# Patient Record
Sex: Female | Born: 1985 | Race: White | Hispanic: No | Marital: Single | Smoking: Never smoker
Health system: Southern US, Community
[De-identification: ages and names within clinical notes are randomized; demographics above are authoritative.]

## PROBLEM LIST (undated history)

## (undated) DIAGNOSIS — J189 Pneumonia, unspecified organism: Secondary | ICD-10-CM

## (undated) DIAGNOSIS — Z87440 Personal history of urinary (tract) infections: Secondary | ICD-10-CM

## (undated) DIAGNOSIS — C801 Malignant (primary) neoplasm, unspecified: Secondary | ICD-10-CM

## (undated) HISTORY — PX: WISDOM TOOTH EXTRACTION: SHX21

## (undated) HISTORY — PX: DILATION AND CURETTAGE OF UTERUS: SHX78

## (undated) HISTORY — PX: MOHS SURGERY: SHX181

---
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
## 2008-06-29 IMAGING — CR DG CHEST 2V
2 series · 2 of 2 positions shown · non-contrast
Comparison: none

CLINICAL DATA: Flu symptoms, shortness of breath.  Cough, body aches.
 CHEST - 2 VIEW:

[view not recorded (1 of 2)]
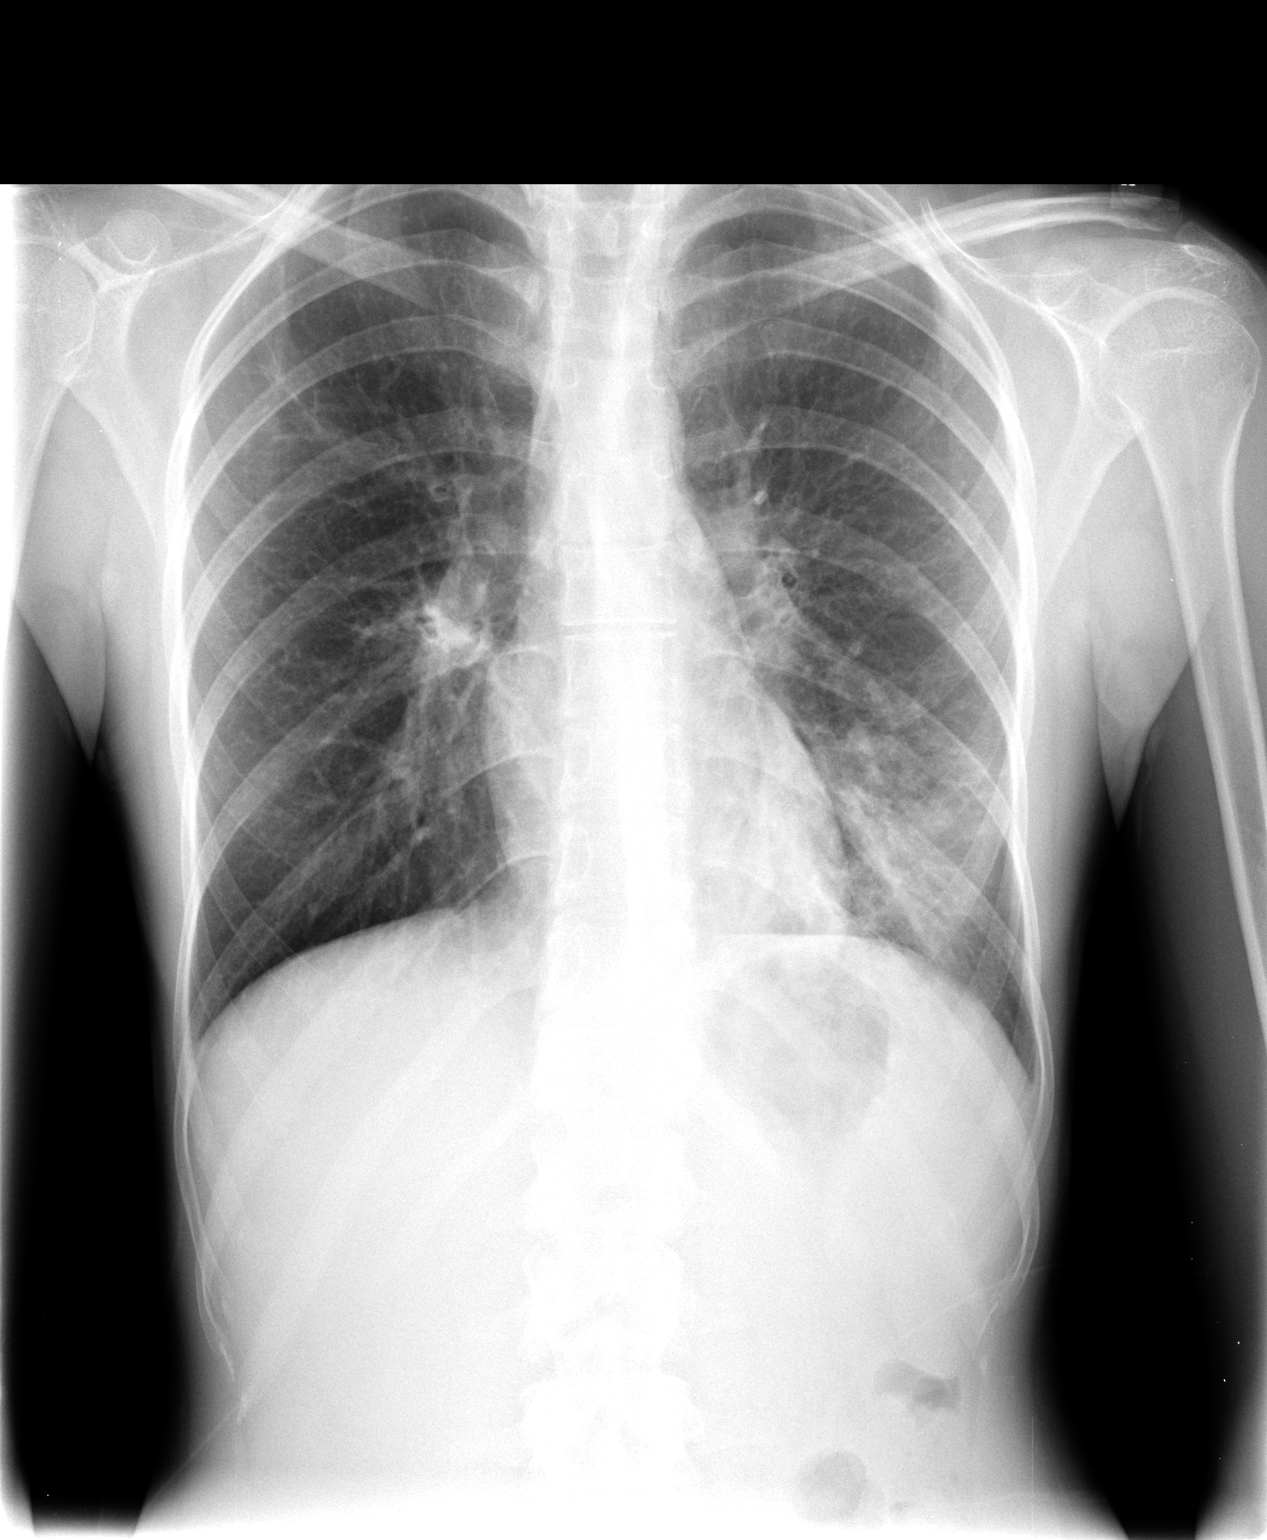

[view not recorded (2 of 2)]
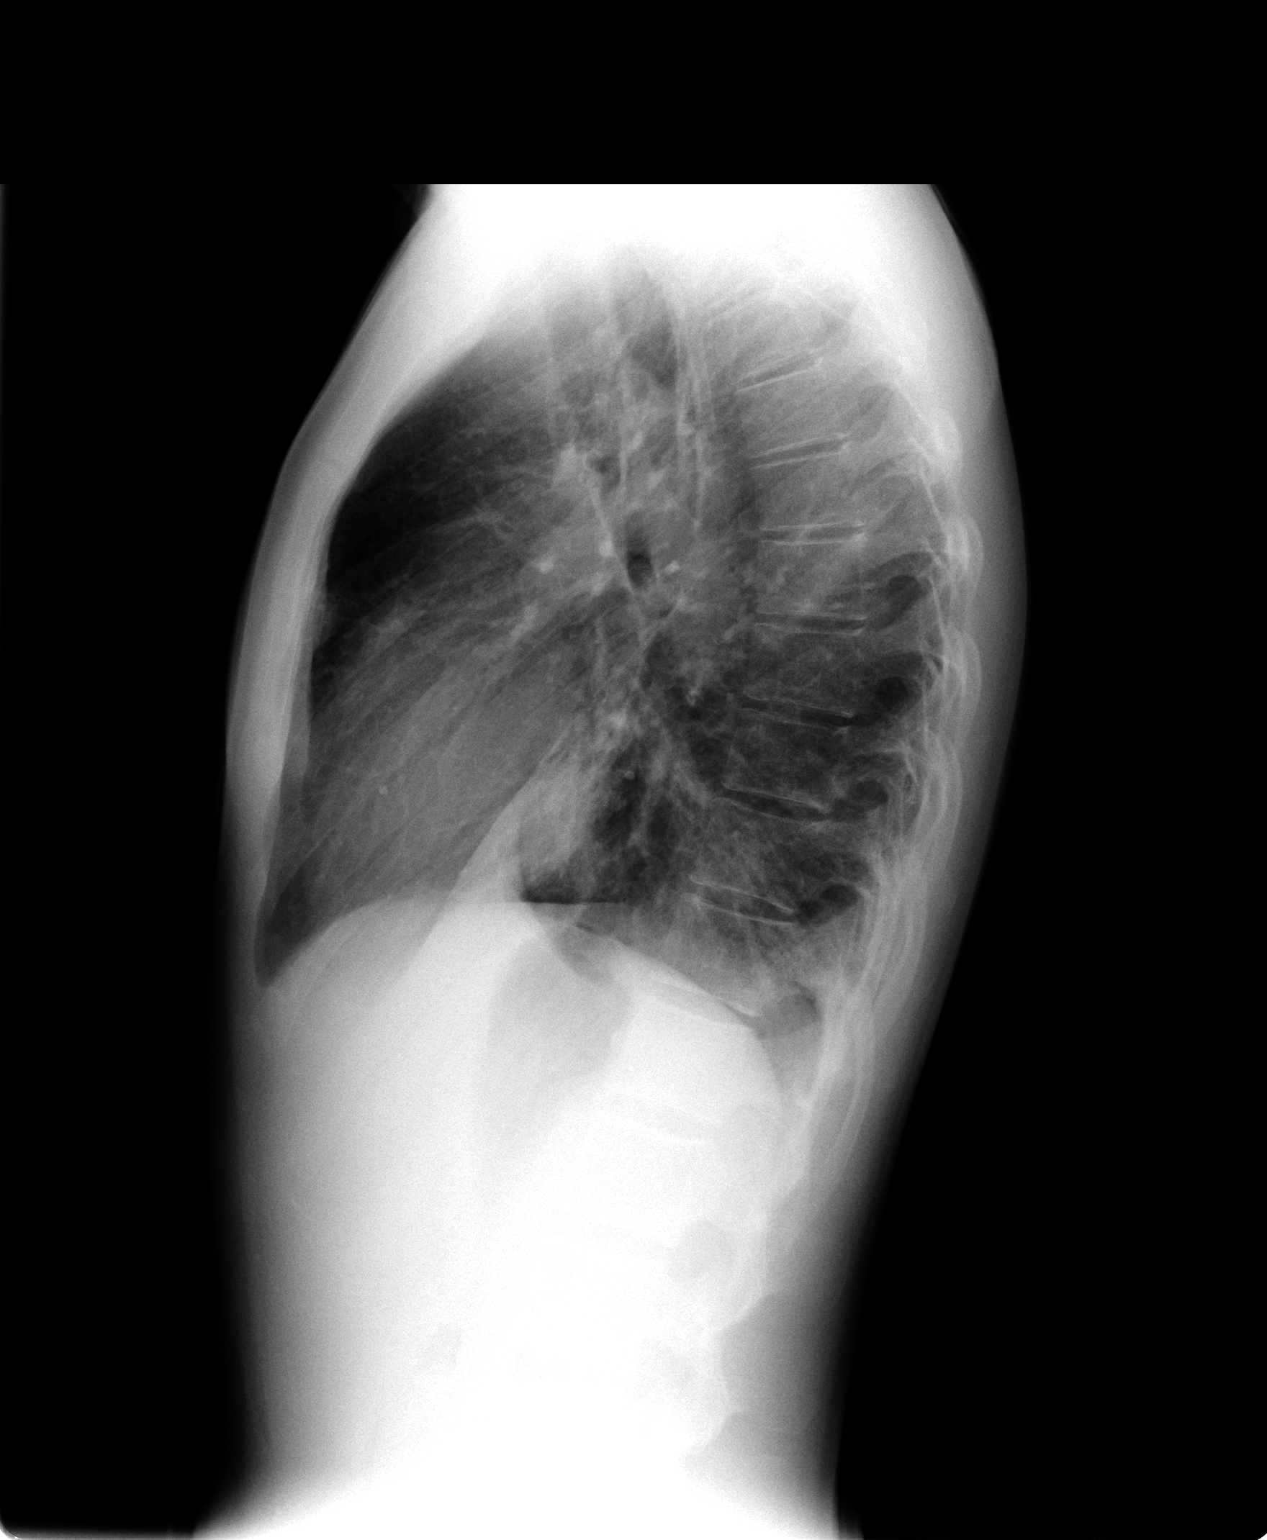

[2 of 2 positions shown; findings below may reference images not displayed]

FINDINGS: Infiltrate left lower lobe, mainly anterior basal segment.  Normal cardiac size, shape.
IMPRESSION: Left lower lobar pneumonia.

## 2013-08-08 ENCOUNTER — Other Ambulatory Visit (HOSPITAL_COMMUNITY): Payer: Self-pay | Admitting: *Deleted

## 2013-08-08 ENCOUNTER — Encounter (HOSPITAL_COMMUNITY)
Admission: RE | Admit: 2013-08-08 | Discharge: 2013-08-08 | Disposition: A | Discharge status: Home / Self Care | Payer: PRIVATE HEALTH INSURANCE | Source: Ambulatory Visit | Attending: Orthopedic Surgery | Admitting: Orthopedic Surgery

## 2013-08-08 ENCOUNTER — Encounter (HOSPITAL_COMMUNITY): Payer: Self-pay

## 2013-08-08 HISTORY — DX: Malignant (primary) neoplasm, unspecified: C80.1

## 2013-08-08 HISTORY — DX: Personal history of urinary (tract) infections: Z87.440

## 2013-08-08 HISTORY — DX: Pneumonia, unspecified organism: J18.9

## 2013-08-08 LAB — CBC
HCT: 39.1 % (ref 36.0–46.0)
HEMOGLOBIN: 14 g/dL (ref 12.0–15.0)
MCH: 31.1 pg (ref 26.0–34.0)
MCHC: 35.8 g/dL (ref 30.0–36.0)
MCV: 86.9 fL (ref 78.0–100.0)
Platelets: 290 10*3/uL (ref 150–400)
RBC: 4.5 MIL/uL (ref 3.87–5.11)
RDW: 12.4 % (ref 11.5–15.5)
WBC: 12 10*3/uL — AB (ref 4.0–10.5)

## 2013-08-08 LAB — HCG, SERUM, QUALITATIVE: Preg, Serum: NEGATIVE

## 2013-08-08 NOTE — Progress Notes (Signed)
Called Dr. Angus Palms office about consent order. Order states left ring finger and the finger that is injured in the left long finger. Receptionist states she will send message and have him call me. He returned my call and ok'ed that I change the order.

## 2013-08-08 NOTE — Pre-Procedure Instructions (Signed)
Sarah Michael  08/08/2013   Your procedure is scheduled on:  Saturday, August 10, 2013 at 10:20 AM.   Report to [facility] Registration at 8:20 AM.   Call this number if you have problems the morning of surgery: [phone]   Remember:   Do not eat food or drink liquids after midnight Friday, 08/09/13.   Take these medicines the morning of surgery with A SIP OF WATER: Hydrocodone-Acetaminophen   Stop Vitamins as of today   Do not wear jewelry, make-up or nail polish.  Do not wear lotions, powders, or perfumes. You may wear deodorant.  Do not shave 48 hours prior to surgery.   Do not bring valuables to the hospital.  [facility] is not responsible                  for any belongings or valuables.               Contacts, dentures or bridgework may not be worn into surgery.  Leave suitcase in the car. After surgery it may be brought to your room.  For patients admitted to the hospital, discharge time is determined by your                treatment team.               Patients discharged the day of surgery will not be allowed to drive  home.  Name and phone number of your driver: Family/friend   Special Instructions: [place] - Preparing for Surgery  Before surgery, you can play an important role.  Because skin is not sterile, your skin needs to be as free of germs as possible.  You can reduce the number of germs on you skin by washing with CHG (chlorahexidine gluconate) soap before surgery.  CHG is an antiseptic cleaner which kills germs and bonds with the skin to continue killing germs even after washing.  Please DO NOT use if you have an allergy to CHG or antibacterial soaps.  If your skin becomes reddened/irritated stop using the CHG and inform your nurse when you arrive at Short Stay.  Do not shave (including legs and underarms) for at least 48 hours prior to the first CHG shower.  You may shave your face.  Please follow these instructions  carefully:   1.  Shower with CHG Soap the night before surgery and the                                morning of Surgery.  2.  If you choose to wash your hair, wash your hair first as usual with your       normal shampoo.  3.  After you shampoo, rinse your hair and body thoroughly to remove the                      Shampoo.  4.  Use CHG as you would any other liquid soap.  You can apply chg directly       to the skin and wash gently with scrungie or a clean washcloth.  5.  Apply the CHG Soap to your body ONLY FROM THE NECK DOWN.        Do not use on open wounds or open sores.  Avoid contact with your eyes, ears, mouth and genitals (private parts).  Wash genitals (private parts) with your  normal soap.  6.  Wash thoroughly, paying special attention to the area where your surgery        will be performed.  7.  Thoroughly rinse your body with warm water from the neck down.  8.  DO NOT shower/wash with your normal soap after using and rinsing off       the CHG Soap.  9.  Pat yourself dry with a clean towel.            10.  Wear clean pajamas.            11.  Place clean sheets on your bed the night of your first shower and do not        sleep with pets.  Day of Surgery  Do not apply any lotions the morning of surgery.  Please wear clean clothes to the hospital/surgery center.     Please read over the following fact sheets that you were given: Pain Booklet, Coughing and Deep Breathing and Surgical Site Infection Prevention

## 2013-08-10 ENCOUNTER — Ambulatory Visit (HOSPITAL_COMMUNITY)
Admission: RE | Admit: 2013-08-10 | Discharge: 2013-08-10 | Disposition: A | Discharge status: Home / Self Care | Payer: PRIVATE HEALTH INSURANCE | Source: Ambulatory Visit | Attending: Orthopedic Surgery | Admitting: Orthopedic Surgery

## 2013-08-10 ENCOUNTER — Encounter (HOSPITAL_COMMUNITY)
Admission: RE | Disposition: A | Discharge status: Home / Self Care | Payer: Self-pay | Source: Ambulatory Visit | Attending: Orthopedic Surgery

## 2013-08-10 ENCOUNTER — Ambulatory Visit (HOSPITAL_COMMUNITY): Payer: PRIVATE HEALTH INSURANCE | Admitting: Anesthesiology

## 2013-08-10 ENCOUNTER — Encounter (HOSPITAL_COMMUNITY): Payer: Self-pay | Admitting: *Deleted

## 2013-08-10 ENCOUNTER — Encounter (HOSPITAL_COMMUNITY): Payer: PRIVATE HEALTH INSURANCE | Admitting: Anesthesiology

## 2013-08-10 DIAGNOSIS — X58XXXA Exposure to other specified factors, initial encounter: Secondary | ICD-10-CM | POA: Insufficient documentation

## 2013-08-10 DIAGNOSIS — IMO0002 Reserved for concepts with insufficient information to code with codable children: Secondary | ICD-10-CM | POA: Insufficient documentation

## 2013-08-10 HISTORY — PX: CLOSED REDUCTION FINGER WITH PERCUTANEOUS PINNING: SHX5612

## 2013-08-10 SURGERY — CLOSED REDUCTION, FINGER, WITH PERCUTANEOUS PINNING
Anesthesia: General | Site: Finger | Laterality: Left

## 2013-08-10 MED ORDER — OXYCODONE-ACETAMINOPHEN 5-325 MG PO TABS
ORAL_TABLET | ORAL | Status: AC
  Administered 2013-08-10: 2 via ORAL
  Filled 2013-08-10: qty 2

## 2013-08-10 MED ORDER — MIDAZOLAM HCL 2 MG/2ML IJ SOLN
INTRAMUSCULAR | Status: AC
  Filled 2013-08-10: qty 2

## 2013-08-10 MED ORDER — CLINDAMYCIN PHOSPHATE 900 MG/50ML IV SOLN
900.0000 mg | INTRAVENOUS | Status: DC
Start: 2013-08-10 — End: 2013-08-10

## 2013-08-10 MED ORDER — LIDOCAINE HCL (CARDIAC) 20 MG/ML IV SOLN
INTRAVENOUS | Status: AC
  Filled 2013-08-10: qty 5

## 2013-08-10 MED ORDER — FENTANYL CITRATE 0.05 MG/ML IJ SOLN
INTRAMUSCULAR | Status: AC
  Filled 2013-08-10: qty 5

## 2013-08-10 MED ORDER — HYDROMORPHONE HCL PF 1 MG/ML IJ SOLN
INTRAMUSCULAR | Status: AC
  Administered 2013-08-10: 0.5 mg via INTRAVENOUS
  Filled 2013-08-10: qty 1

## 2013-08-10 MED ORDER — PROPOFOL 10 MG/ML IV BOLUS
INTRAVENOUS | Status: AC
  Filled 2013-08-10: qty 20

## 2013-08-10 MED ORDER — BUPIVACAINE HCL (PF) 0.25 % IJ SOLN
INTRAMUSCULAR | Status: DC | PRN
  Administered 2013-08-10: 10 mL

## 2013-08-10 MED ORDER — ONDANSETRON HCL 4 MG/2ML IJ SOLN
INTRAMUSCULAR | Status: AC
  Filled 2013-08-10: qty 2

## 2013-08-10 MED ORDER — MIDAZOLAM HCL 5 MG/5ML IJ SOLN
INTRAMUSCULAR | Status: DC | PRN
  Administered 2013-08-10: 1 mg via INTRAVENOUS

## 2013-08-10 MED ORDER — LACTATED RINGERS IV SOLN
INTRAVENOUS | Status: DC | PRN
  Administered 2013-08-10 (×2): via INTRAVENOUS

## 2013-08-10 MED ORDER — BUPIVACAINE HCL (PF) 0.25 % IJ SOLN
INTRAMUSCULAR | Status: AC
  Filled 2013-08-10: qty 30

## 2013-08-10 MED ORDER — HYDROMORPHONE HCL PF 1 MG/ML IJ SOLN
0.2500 mg | INTRAMUSCULAR | Status: DC | PRN
  Administered 2013-08-10 (×2): 0.5 mg via INTRAVENOUS

## 2013-08-10 MED ORDER — DOCUSATE SODIUM 100 MG PO CAPS: 100.0000 mg | ORAL_CAPSULE | Freq: Two times a day (BID) | ORAL | Status: AC

## 2013-08-10 MED ORDER — 0.9 % SODIUM CHLORIDE (POUR BTL) OPTIME
TOPICAL | Status: DC | PRN
  Administered 2013-08-10: 1000 mL

## 2013-08-10 MED ORDER — OXYCODONE-ACETAMINOPHEN 5-325 MG PO TABS: 1.0000 | ORAL_TABLET | ORAL | Status: AC | PRN

## 2013-08-10 MED ORDER — CHLORHEXIDINE GLUCONATE 4 % EX LIQD
60.0000 mL | Freq: Once | CUTANEOUS | Status: DC
  Filled 2013-08-10: qty 60

## 2013-08-10 MED ORDER — ONDANSETRON HCL 4 MG/2ML IJ SOLN
INTRAMUSCULAR | Status: DC | PRN
  Administered 2013-08-10: 4 mg via INTRAVENOUS

## 2013-08-10 MED ORDER — FENTANYL CITRATE 0.05 MG/ML IJ SOLN
INTRAMUSCULAR | Status: DC | PRN
  Administered 2013-08-10: 50 ug via INTRAVENOUS

## 2013-08-10 MED ORDER — CLINDAMYCIN PHOSPHATE 900 MG/50ML IV SOLN
INTRAVENOUS | Status: AC
  Administered 2013-08-10: 900 mg via INTRAVENOUS
  Filled 2013-08-10: qty 50

## 2013-08-10 MED ORDER — OXYCODONE-ACETAMINOPHEN 5-325 MG PO TABS
1.0000 | ORAL_TABLET | Freq: Once | ORAL | Status: AC
  Administered 2013-08-10: 2 via ORAL

## 2013-08-10 MED ORDER — LIDOCAINE HCL (CARDIAC) 20 MG/ML IV SOLN
INTRAVENOUS | Status: DC | PRN
  Administered 2013-08-10: 90 mg via INTRAVENOUS

## 2013-08-10 MED ORDER — PROPOFOL 10 MG/ML IV BOLUS
INTRAVENOUS | Status: DC | PRN
  Administered 2013-08-10: 170 mg via INTRAVENOUS

## 2013-08-10 SURGICAL SUPPLY — 35 items
BANDAGE CONFORM 2  STR LF (GAUZE/BANDAGES/DRESSINGS) ×3 IMPLANT
BANDAGE ELASTIC 3 VELCRO ST LF (GAUZE/BANDAGES/DRESSINGS) IMPLANT
BANDAGE ELASTIC 4 VELCRO ST LF (GAUZE/BANDAGES/DRESSINGS) IMPLANT
BANDAGE GAUZE ELAST BULKY 4 IN (GAUZE/BANDAGES/DRESSINGS) IMPLANT
BENZOIN TINCTURE PRP APPL 2/3 (GAUZE/BANDAGES/DRESSINGS) IMPLANT
BLADE SURG ROTATE 9660 (MISCELLANEOUS) IMPLANT
BNDG COHESIVE 1X5 TAN STRL LF (GAUZE/BANDAGES/DRESSINGS) ×3 IMPLANT
CLOSURE WOUND 1/2 X4 (GAUZE/BANDAGES/DRESSINGS)
COVER SURGICAL LIGHT HANDLE (MISCELLANEOUS) ×3 IMPLANT
CUFF TOURNIQUET SINGLE 18IN (TOURNIQUET CUFF) ×3 IMPLANT
CUFF TOURNIQUET SINGLE 24IN (TOURNIQUET CUFF) IMPLANT
DRAPE OEC MINIVIEW 54X84 (DRAPES) ×3 IMPLANT
DRSG EMULSION OIL 3X3 NADH (GAUZE/BANDAGES/DRESSINGS) IMPLANT
GAUZE XEROFORM 1X8 LF (GAUZE/BANDAGES/DRESSINGS) ×3 IMPLANT
GLOVE BIOGEL PI IND STRL 8.5 (GLOVE) ×1 IMPLANT
GLOVE BIOGEL PI INDICATOR 8.5 (GLOVE) ×2
GLOVE SURG ORTHO 8.0 STRL STRW (GLOVE) ×3 IMPLANT
GOWN STRL REUS W/ TWL LRG LVL3 (GOWN DISPOSABLE) ×2 IMPLANT
GOWN STRL REUS W/TWL LRG LVL3 (GOWN DISPOSABLE) ×4
KIT BASIN OR (CUSTOM PROCEDURE TRAY) ×3 IMPLANT
KIT ROOM TURNOVER OR (KITS) ×3 IMPLANT
NS IRRIG 1000ML POUR BTL (IV SOLUTION) ×3 IMPLANT
PACK ORTHO EXTREMITY (CUSTOM PROCEDURE TRAY) ×3 IMPLANT
PAD ARMBOARD 7.5X6 YLW CONV (MISCELLANEOUS) ×6 IMPLANT
SPLINT FINGER (SOFTGOODS) ×3 IMPLANT
SPONGE GAUZE 4X4 12PLY (GAUZE/BANDAGES/DRESSINGS) ×3 IMPLANT
STRIP CLOSURE SKIN 1/2X4 (GAUZE/BANDAGES/DRESSINGS) IMPLANT
SUT ETHILON 4 0 P 3 18 (SUTURE) IMPLANT
SUT ETHILON 5 0 P 3 18 (SUTURE)
SUT NYLON ETHILON 5-0 P-3 1X18 (SUTURE) IMPLANT
SUT PROLENE 4 0 P 3 18 (SUTURE) IMPLANT
TOWEL OR 17X24 6PK STRL BLUE (TOWEL DISPOSABLE) ×3 IMPLANT
TOWEL OR 17X26 10 PK STRL BLUE (TOWEL DISPOSABLE) ×3 IMPLANT
WATER STERILE IRR 1000ML POUR (IV SOLUTION) IMPLANT
k-wire 0.9mm (0.035") ×3 IMPLANT

## 2013-08-10 NOTE — Progress Notes (Signed)
Pt has a friend who will drive her home. Pt says her boyfriend is at work, but will be with her once she gets home.

## 2013-08-10 NOTE — H&P (Signed)
Sarah Michael is an 28 y.o. female.   Chief Complaint: LEFT LONG FINGER INJURY HPI: PT SEEN/EVALUATED IN OFFICE PT WITH INJURY TO LEFT LONG FINGER. PT WITH LONG FINGER MIDDLE PHALANX FRACTURE PT HERE FOR SURGERY  Past Medical History  Diagnosis Date  . Pneumonia   . History of kidney infection   . 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 Mohs surgery      on her back  . Wisdom tooth extraction    . Dilation and curettage of uterus      for a miscarriage    Family History  Problem Relation Age of Onset  . Cancer - Other Father    Social History:  reports that she has never smoked. She has never used smokeless tobacco. She reports that she drinks alcohol. She reports that she does not use illicit drugs.  Allergies:  Allergies  Allergen Reactions  . Amoxicillin Hives    Medications Prior to Admission  Medication Sig Dispense Refill  . doxycycline (VIBRAMYCIN) 100 MG capsule Take 100 mg by mouth 2 (two) times daily.      . Hydrocodone-Acetaminophen 5-300 MG TABS Take 1 tablet by mouth every 4 (four) hours as needed (pain).      . Multiple Vitamins-Minerals (MULTIVITAMIN WITH MINERALS) tablet Take 1 tablet by mouth daily.        Results for orders placed during the hospital encounter of 08/08/13 (from the past 48 hour(s))  CBC     Status: Abnormal   Collection Time    08/08/13  3:05 PM      Result Value Ref Range   WBC 12.0 (*) 4.0 - 10.5 K/uL   RBC 4.50  3.87 - 5.11 MIL/uL   Hemoglobin 14.0  12.0 - 15.0 g/dL   HCT 39.1  36.0 - 46.0 %   MCV 86.9  78.0 - 100.0 fL   MCH 31.1  26.0 - 34.0 pg   MCHC 35.8  30.0 - 36.0 g/dL   RDW 12.4  11.5 - 15.5 %   Platelets 290  150 - 400 K/uL  HCG, SERUM, QUALITATIVE     Status: None   Collection Time    08/08/13  3:05 PM      Result Value Ref Range   Preg, Serum NEGATIVE  NEGATIVE   Comment:            THE SENSITIVITY OF THIS     METHODOLOGY IS >10 mIU/mL.   No  results found.  ROS NO RECENT ILLNESSES OR HOSPITALIZATIONS  Blood pressure 114/64, pulse 52, temperature 98 F (36.7 C), temperature source Oral, resp. rate 16, last menstrual period 08/08/2013, SpO2 100.00%. Physical Exam  General Appearance:  Alert, cooperative, no distress, appears stated age  Head:  Normocephalic, without obvious abnormality, atraumatic  Eyes:  Pupils equal, conjunctiva/corneas clear,         Throat: Lips, mucosa, and tongue normal; teeth and gums normal  Neck: No visible masses     Lungs:   respirations unlabored  Chest Wall:  No tenderness or deformity  Heart:  Regular rate and rhythm,  Abdomen:   Soft, non-tender,         Extremities: LEFT LONG FINGER WITH ECCHYMOSES AND SWELLING LIMITED DIGITAL MOBILITY FINGER TIP WARM WELL PERFUSED GOOD CAP REFIL NO INJURY TO INDEX/RING/SMALL AND THUMB GOOD WRIST MOBILITY  Pulses: 2+ and symmetric  Skin: Skin color, texture, turgor normal,  no rashes or lesions     Neurologic: Normal    Assessment/Plan LEFT LONG FINGER DISPLACED INTRAARTICULAR MIDDLE PHALANX FRACTURE  LEFT LONG FINGER CLOSED REDUCTION AND PINNING POSSIBLE OPEN REDUCTION AND INTERNAL FIXATION  R/B/A DISCUSSED WITH PT IN OFFICE.  PT VOICED UNDERSTANDING OF PLAN CONSENT SIGNED DAY OF SURGERY PT SEEN AND EXAMINED PRIOR TO OPERATIVE PROCEDURE/DAY OF SURGERY SITE MARKED. QUESTIONS ANSWERED WILL GO HOME FOLLOWING SURGERY  Linna Hoff 08/10/2013, 8:58 AM

## 2013-08-10 NOTE — Op Note (Signed)
NAME:  KINZE, LABO NO.:  [id number]  MEDICAL RECORD NO.:  [id number]  LOCATION:  [facility]                         FACILITY:  [facility]  PHYSICIAN:  Melrose Nakayama, MD  DATE OF BIRTH:  [birth year]  DATE OF PROCEDURE:  08/10/2013 DATE OF DISCHARGE:  08/10/2013                              OPERATIVE REPORT   PREOPERATIVE DIAGNOSIS:  Left long finger articular fracture involving the distal interphalangeal joint.  POSTOPERATIVE DIAGNOSIS:  Left long finger articular fracture involving the distal interphalangeal joint.  ATTENDING PHYSICIAN:  Linna Hoff IV, MD, who scrubbed and present for the entire procedure.  ASSISTANT SURGEON:  None.  ANESTHESIA:  General via LMA.  SURGICAL PROCEDURES: 1. Left long finger closed reduction and percutaneous skeletal     fixation of unstable articular fracture of the left long finger     middle phalanx. 2. Radiographs 2 views, left long finger.  SURGICAL IMPLANTS:  Two 0.035 K-wires.  SURGICAL INDICATIONS:  Ms. Steppe is a right-hand dominant female who was seen and evaluated in the office and found a displaced intra- articular fracture.  The patient was seen and evaluated in the office and recommended to undergo the above procedure.  Risks, benefits, and alternatives were discussed in detail with the patient.  Signed informed consent was obtained.  Risks include but not limited to bleeding, infection, damage to nearby nerves, arteries, or tendons, nonunion, malunion, hardware failure, loss of motion wrist and digits, incomplete relief of symptoms, and need for further surgical intervention.  DESCRIPTION OF PROCEDURE:  The patient was properly identified in the preoperative holding area and marked with a permanent marker made on left long finger to indicate correct operative site.  The patient was brought back to the operating room, placed supine on the anesthesia table.  General anesthesia was administered.  The  patient tolerated this well.  A well-padded tourniquet was placed on left brachium and sealed with 1000 drape.  Left upper extremity was then prepped and draped in normal sterile fashion.  Time-out was called, correct side was identified, and procedure then begun.  Attention then turned to the left long finger where we used the mini C-arm.  The fracture site was then identified and then the small reduction clamp was then placed across the fracture site engaging the fracture site and reducing it nicely. Following this, the articular fracture was reduced and confirmed using mini C-arm.  Following this, two 0.035 K-wires were then placed across the fracture site and reduction clamp was then removed.  Final radiographs were obtained.  K-wire was then bent and cut and left out of the skin.  Xeroform was then placed around the pin sites.  Sterile compressive bandage was then applied.  The patient was placed in a small finger splint, extubated, and taken to recovery room in good condition.  Intraoperative radiographs 2 views of the finger do show the internal fixation in place in good position.  POSTPROCEDURE PLAN:  The patient was discharged to home, seen back in the office in approximately 10 days for x-rays, out of the splint, back into the finger splint for a total of 4 weeks with pins in.  X-rays at the  4-week mark for pin removal and then gradually use and activity. Radiographs at each visit.     Melrose Nakayama, MD     FWO/MEDQ  D:  08/10/2013  T:  08/10/2013  Job:  [id number]

## 2013-08-10 NOTE — Transfer of Care (Signed)
Immediate Anesthesia Transfer of Care Note  Patient: Sarah Michael  Procedure(s) Performed: Procedure(s): CLOSED REDUCTION LEFT LONG FINGER WITH PERCUTANEOUS PINNING  (Left)  Patient Location: PACU  Anesthesia Type:General  Level of Consciousness: awake, oriented and sedated  Airway & Oxygen Therapy: Patient Spontanous Breathing and Patient connected to nasal cannula oxygen  Post-op Assessment: Report given to PACU RN, Post -op Vital signs reviewed and stable and Patient moving all extremities  Post vital signs: Reviewed and stable  Complications: No apparent anesthesia complications

## 2013-08-10 NOTE — Anesthesia Preprocedure Evaluation (Addendum)
Anesthesia Evaluation  Patient identified by MRN, date of birth, ID band Patient awake    Reviewed: Allergy & Precautions, H&P , NPO status , Patient's Chart, lab work & pertinent test results  Airway Mallampati: II TM Distance: >3 FB Neck ROM: Full    Dental  (+) Teeth Intact, Dental Advisory Given   Pulmonary pneumonia -, resolved,  breath sounds clear to auscultation        Cardiovascular negative cardio ROS  Rhythm:Regular Rate:Normal     Neuro/Psych    GI/Hepatic negative GI ROS,   Endo/Other  negative endocrine ROS  Renal/GU negative Renal ROS     Musculoskeletal   Abdominal   Peds  Hematology   Anesthesia Other Findings   Reproductive/Obstetrics                          Anesthesia Physical Anesthesia Plan  ASA: I and emergent  Anesthesia Plan: General   Post-op Pain Management:    Induction:   Airway Management Planned: LMA  Additional Equipment:   Intra-op Plan:   Post-operative Plan: Extubation in OR  Informed Consent: I have reviewed the patients History and Physical, chart, labs and discussed the procedure including the risks, benefits and alternatives for the proposed anesthesia with the patient or authorized representative who has indicated his/her understanding and acceptance.   Dental advisory given  Plan Discussed with: CRNA and Anesthesiologist  Anesthesia Plan Comments:         Anesthesia Quick Evaluation

## 2013-08-10 NOTE — Brief Op Note (Signed)
08/10/2013  9:00 AM  PATIENT:  Sarah Michael  28 y.o. female  PRE-OPERATIVE DIAGNOSIS:  Left LONG finger middle phalanx fracture  POST-OPERATIVE DIAGNOSIS:  SAME  PROCEDURE:  LEFT LONG FINGER CLOSED REDUCTION AND PINNING  SURGEON:  Surgeon(s) and Role:    * Linna Hoff, MD - Primary  PHYSICIAN ASSISTANT:   ASSISTANTS: none   ANESTHESIA:   general  EBL:     BLOOD ADMINISTERED:none  DRAINS: none   LOCAL MEDICATIONS USED:  MARCAINE     SPECIMEN:  No Specimen  DISPOSITION OF SPECIMEN:  N/A  COUNTS:  YES  TOURNIQUET:    DICTATION: .[id number]  PLAN OF CARE: Discharge to home after PACU  PATIENT DISPOSITION:  PACU - hemodynamically stable.   Delay start of Pharmacological VTE agent (>24hrs) due to surgical blood loss or risk of bleeding: not applicable

## 2013-08-10 NOTE — Discharge Instructions (Signed)
KEEP BANDAGE CLEAN AND DRY CALL OFFICE FOR F/U APPT [phone] IN 10 DAYS DR. [place] PHONE [phone] KEEP HAND ELEVATED ABOVE HEART OK TO APPLY ICE TO OPERATIVE AREA CONTACT OFFICE IF ANY WORSENING PAIN OR CONCERNS.

## 2013-08-10 NOTE — Progress Notes (Signed)
Attempted to call patient at number listed. Left message with Emergency Contact.

## 2013-08-10 NOTE — Anesthesia Postprocedure Evaluation (Signed)
  Anesthesia Post-op Note  Patient: Sarah Michael  Procedure(s) Performed: Procedure(s): CLOSED REDUCTION LEFT LONG FINGER WITH PERCUTANEOUS PINNING  (Left)  Patient Location: PACU  Anesthesia Type:General  Level of Consciousness: awake  Airway and Oxygen Therapy: Patient Spontanous Breathing  Post-op Pain: mild  Post-op Assessment: Post-op Vital signs reviewed  Post-op Vital Signs: Reviewed  Complications: No apparent anesthesia complications

## 2013-08-10 NOTE — Progress Notes (Signed)
Got update phone number from grandmother patient will walk dog and be on her way.

## 2013-08-13 ENCOUNTER — Encounter (HOSPITAL_COMMUNITY): Payer: Self-pay | Admitting: Orthopedic Surgery

## 2016-07-04 ENCOUNTER — Telehealth: Payer: Self-pay | Admitting: *Deleted

## 2016-07-04 NOTE — Telephone Encounter (Signed)
Entered in error
